# Patient Record
Sex: Female | Born: 1935 | Race: Black or African American | Hispanic: No | Smoking: Never smoker
Health system: Southern US, Community
[De-identification: ages and names within clinical notes are randomized; demographics above are authoritative.]

## PROBLEM LIST (undated history)

## (undated) ENCOUNTER — Emergency Department (HOSPITAL_COMMUNITY): Admission: EM | Payer: Medicare Other | Source: Home / Self Care

## (undated) DIAGNOSIS — I10 Essential (primary) hypertension: Secondary | ICD-10-CM

## (undated) DIAGNOSIS — E785 Hyperlipidemia, unspecified: Secondary | ICD-10-CM

## (undated) DIAGNOSIS — E538 Deficiency of other specified B group vitamins: Secondary | ICD-10-CM

## (undated) HISTORY — DX: Hyperlipidemia, unspecified: E78.5

## (undated) HISTORY — DX: Deficiency of other specified B group vitamins: E53.8

## (undated) HISTORY — DX: Essential (primary) hypertension: I10

---

## 2001-09-29 ENCOUNTER — Encounter: Payer: Self-pay | Admitting: Cardiology

## 2001-09-29 ENCOUNTER — Ambulatory Visit (HOSPITAL_COMMUNITY): Admission: RE | Admit: 2001-09-29 | Discharge: 2001-09-29 | Payer: Self-pay

## 2002-12-07 ENCOUNTER — Ambulatory Visit (HOSPITAL_COMMUNITY): Admission: RE | Admit: 2002-12-07 | Discharge: 2002-12-07 | Payer: Self-pay | Admitting: Cardiology

## 2002-12-07 ENCOUNTER — Encounter: Payer: Self-pay | Admitting: Cardiology

## 2003-02-01 DIAGNOSIS — D126 Benign neoplasm of colon, unspecified: Secondary | ICD-10-CM | POA: Insufficient documentation

## 2005-11-25 ENCOUNTER — Inpatient Hospital Stay (HOSPITAL_COMMUNITY): Admission: RE | Admit: 2005-11-25 | Discharge: 2005-11-26 | Payer: Self-pay | Admitting: General Surgery

## 2007-05-26 ENCOUNTER — Ambulatory Visit: Payer: Self-pay | Admitting: Gastroenterology

## 2007-06-23 ENCOUNTER — Ambulatory Visit: Payer: Self-pay | Admitting: Gastroenterology

## 2007-12-01 DIAGNOSIS — I10 Essential (primary) hypertension: Secondary | ICD-10-CM | POA: Insufficient documentation

## 2007-12-01 DIAGNOSIS — K5730 Diverticulosis of large intestine without perforation or abscess without bleeding: Secondary | ICD-10-CM | POA: Insufficient documentation

## 2008-07-05 ENCOUNTER — Encounter: Admission: RE | Admit: 2008-07-05 | Discharge: 2008-07-05 | Payer: Self-pay | Admitting: Cardiology

## 2008-08-13 ENCOUNTER — Encounter: Admission: RE | Admit: 2008-08-13 | Discharge: 2008-08-13 | Payer: Self-pay | Admitting: Cardiology

## 2009-11-14 ENCOUNTER — Ambulatory Visit (HOSPITAL_COMMUNITY): Admission: RE | Admit: 2009-11-14 | Discharge: 2009-11-14 | Payer: Self-pay | Admitting: Cardiology

## 2011-01-26 NOTE — Assessment & Plan Note (Signed)
[facility]                         GASTROENTEROLOGY OFFICE NOTE   NAME:[name], [name]                  MRN:          [id number]  DATE:05/26/2007                            DOB:          1930/05/28    REFERRING PHYSICIAN:  Osvaldo Patton. [name], M.D.   NEW PATIENT EVALUATION:  [name] is a 77 year old African-American  female with essential hypertension, who is status post repair of a  rather prominent femoral hernia in the spring of 2007.  She continues  with mild constipation, gas and bloating, and is referred by Dr. Shana Patton  for consideration of colonoscopy.  She denies rectal bleeding or melena,  anorexia or weight-loss, any upper GI or hepatobiliary complaints.  Her  appetite is good and her weight is stable.   She had colonoscopy in May of 2004, with removal of an 8 mm adenomatous  polyp.   Recent lab data at Dr. Magda Patton office showed a normal CBC and  metabolic profile.  Actually, in reviewing these labs, these are from  2005.  At that time, she also had CT scan of the abdomen, which  demonstrated a femoral hernia and calcified uterine fibroids.   PAST MEDICAL HISTORY:  [name] has hypertension, but has never had  strokes or peripheral vascular disease, MIs or angina pectoris.   She takes Norvasc 10 mg a day, daily atenolol and multivitamins.   She denies drug allergies.   FAMILY HISTORY:  Remarkable for diabetes and atherosclerotic  cardiovascular disease in multiple family members.   SOCIAL HISTORY:  The patient is widowed and lives with her daughter.  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  She does  not smoke or use ethanol.   REVIEW OF SYSTEMS:  Entirely noncontributory, without any current  cardiovascular, pulmonary, neurological or psychiatric problems.   EXAM:  Shows her to be a healthy, black female, in no acute distress.  She is 4 feet 11 inches tall and weighs 141 pounds.  Blood pressure is  124/66 and pulse was 56 and regular.  I could not appreciate stigmata of chronic liver disease or thyromegaly.  Her chest was clear to percussion and auscultation and she seemed to be  in a regular rhythm without significant murmurs, gallops or rubs.  There was no organomegaly, masses or tenderness.  She had multiple well-  healed lower abdominal surgical scars.  Peripheral extremities were unremarkable.  Mental status was clear.  Inspection of rectum was unremarkable, as was rectal exam.  Stool was  guaiac-negative.   ASSESSMENT:  1. Need for followup colonoscopy in a 77 year old African-American      female, who had a previous significant adenomatous polyp.  2. Mild functional constipation.  3. Well-controlled essential hypertension.  4. Status post repair of a right femoral hernia.  5. Calcified uterine fibroids.   RECOMMENDATIONS:  I have gone ahead and scheduled [name] for  outpatient colonoscopy at her convenience.  I have asked her to keep  herself well-hydrated with the bowel prep and we will monitor her  closely during this exam.  The risks and benefits have been explained in  detail and  she has agreed to proceed as planned.  She will continue all  of her other medications as per Dr. Shana Patton.     Gail Rea. Jarold Motto, MD, [name], [name]  Electronically Signed    DRP/MedQ  DD: 05/26/2007  DT: 05/26/2007  Job #: [id number]

## 2011-01-29 NOTE — Op Note (Signed)
[name], [name]           ACCOUNT NO.:  [id number]   MEDICAL RECORD NO.:  [id number]          PATIENT TYPE:  AMB   LOCATION:  DAY                          FACILITY:  [facility]   PHYSICIAN:  Leonie Man, M.D.   DATE OF BIRTH:  18-Aug-1930   DATE OF PROCEDURE:  11/24/2005  DATE OF DISCHARGE:                                 OPERATIVE REPORT   PREOPERATIVE DIAGNOSIS:  Abdominal wall hernia right lower quadrant.   POSTOPERATIVE DIAGNOSIS:  Abdominal wall hernia right lower quadrant.   PROCEDURE:  Laparoscopy and open abdominal wall hernia repair with mesh.   Moure:  Dr. Lurene Shadow   ASSISTANT:  Dr. Lebron Conners.   ANESTHESIA:  General.   Note, Ms. Chapa is a 77 year old lady who presents with an abdominal wall  hernia presenting above the superior anterior iliac spine at the area of the  rectus and oblique muscles.  Thought originally to be a spigelian hernia.  She comes to the operating room for repair of this hernia with the  understanding that we would attempt a laparoscopic repair if this were not  possible, then we would go ahead and do an open repair.  Other risks and  potential benefits of surgery have been discussed, all questions answered  and consent obtained.   PROCEDURE:  Following the induction of satisfactory general anesthesia with  the patient positioned supinely, the abdomen was prepped and draped to be  included in a sterile operative field.  Open laparoscopy is created by  placing a Hassan cannula in the epigastrium and insufflation of the  peritoneal cavity to 14 mmHg using carbon dioxide.  The scope was placed in  the abdomen; multiple adhesions to the anterior abdominal wall are taken  down to the edge of the hernia sac at the medial and inferior edge of the  hernia sac.  We noted that the uterus was directly involved with the hernial  defect and would need to be taken down.  This attachment continued down deep  into the pelvis.  It was therefore  decided that degree of dissection was not  going to be possible, so the laparoscopic approach was abandoned and  attention turned to an open approach.  A transverse incision was made over  the hernial defect, deepened through the skin and subcutaneous tissues, and  the external oblique chondrosis was opened up, carrying this down to the  hernial defect.  However, because of the attachments of the uterus to the  hernial defect, the peritoneum was opened, and the attachments of the uterus  to the hernial defect were taken down.  Having accomplished this, the defect  was followed down and was noted to go down towards the internal inguinal  canal and to involve this.  We therefore decided to dissect superficially  above the internal oblique to fully free the defect.  I then preplaced a  Prolene hernia mesh system through the lower portion of the defect and  protecting the bowel away from the internal portion of the mesh by placing a  Seprafilm over the bowel at this point where it would it come in contact  with mesh.  The exterior portion of the mesh was then deployed down to the  pubic tubercle and continued up the along the conjoined tendon, and the  previous internal ring and again from the pubic tubercle up along the  shelving edge of Poupart's ligament.  The upper portion of the defect was  then repaired after the anterior rectus fascia was closed by placing an  additional mesh that was deployed under the external oblique aponeurosis  over the defect.  This was tacked down and held in place with 0 Novofil  sutures.  Having then accomplished the anterior rectus fascia, the external  oblique aponeurosis was closed over the mesh with a running 2-0 Novofil  suture.  Subcutaneous tissues were then irrigated with normal saline.  Sponge, instrument, and sharp counts were verified.  Subcutaneous tissues closed with running 2-0 Vicryl sutures and the skin  closed with staples.  All trocar sites  were then closed with staples,  sterile dressings applied, anesthetic reversed, and the patient removed from  the operating room to the recovery room in stable condition.  She tolerated  the procedure well.      Leonie Man, M.D.  Electronically Signed     PB/MEDQ  D:  11/24/2005  T:  11/25/2005  Job:  [id number]   cc:   Osvaldo Shipper. Spruill, M.D.  Fax: [phone]

## 2012-07-19 ENCOUNTER — Other Ambulatory Visit: Payer: Self-pay | Admitting: Cardiology

## 2012-07-19 DIAGNOSIS — R29890 Loss of height: Secondary | ICD-10-CM

## 2012-07-19 DIAGNOSIS — Z1231 Encounter for screening mammogram for malignant neoplasm of breast: Secondary | ICD-10-CM

## 2012-08-30 ENCOUNTER — Ambulatory Visit: Payer: Self-pay

## 2012-08-30 ENCOUNTER — Other Ambulatory Visit: Payer: Self-pay

## 2012-09-13 HISTORY — PX: INGUINAL HERNIA REPAIR: SUR1180

## 2012-09-27 ENCOUNTER — Ambulatory Visit
Admission: RE | Admit: 2012-09-27 | Discharge: 2012-09-27 | Disposition: A | Discharge status: Home / Self Care | Payer: Medicare Other | Source: Ambulatory Visit | Attending: Cardiology | Admitting: Cardiology

## 2012-09-27 DIAGNOSIS — R29890 Loss of height: Secondary | ICD-10-CM

## 2012-09-27 DIAGNOSIS — Z1231 Encounter for screening mammogram for malignant neoplasm of breast: Secondary | ICD-10-CM

## 2013-09-13 DIAGNOSIS — I10 Essential (primary) hypertension: Secondary | ICD-10-CM | POA: Insufficient documentation

## 2013-09-13 HISTORY — DX: Essential (primary) hypertension: I10

## 2015-12-15 ENCOUNTER — Other Ambulatory Visit (HOSPITAL_COMMUNITY)
Admission: RE | Admit: 2015-12-15 | Discharge: 2015-12-15 | Disposition: A | Discharge status: Home / Self Care | Payer: Medicare Other | Source: Ambulatory Visit | Attending: Cardiology | Admitting: Cardiology

## 2015-12-15 ENCOUNTER — Other Ambulatory Visit (HOSPITAL_COMMUNITY): Payer: Self-pay | Admitting: Cardiology

## 2015-12-15 ENCOUNTER — Ambulatory Visit (HOSPITAL_COMMUNITY)
Admission: RE | Admit: 2015-12-15 | Discharge: 2015-12-15 | Disposition: A | Discharge status: Home / Self Care | Payer: Medicare Other | Source: Ambulatory Visit | Attending: Cardiology | Admitting: Cardiology

## 2015-12-15 DIAGNOSIS — R0602 Shortness of breath: Secondary | ICD-10-CM | POA: Diagnosis present

## 2015-12-15 DIAGNOSIS — I10 Essential (primary) hypertension: Secondary | ICD-10-CM | POA: Diagnosis not present

## 2015-12-15 LAB — VITAMIN B12: VITAMIN B 12: 303 pg/mL (ref 180–914)

## 2015-12-15 LAB — CBC
HEMATOCRIT: 42.7 % (ref 36.0–46.0)
HEMOGLOBIN: 14.2 g/dL (ref 12.0–15.0)
MCH: 26.8 pg (ref 26.0–34.0)
MCHC: 33.3 g/dL (ref 30.0–36.0)
MCV: 80.6 fL (ref 78.0–100.0)
Platelets: 150 10*3/uL (ref 150–400)
RBC: 5.3 MIL/uL — AB (ref 3.87–5.11)
RDW: 13.1 % (ref 11.5–15.5)
WBC: 7.5 10*3/uL (ref 4.0–10.5)

## 2015-12-15 LAB — URINALYSIS, ROUTINE W REFLEX MICROSCOPIC
Bilirubin Urine: NEGATIVE
Glucose, UA: NEGATIVE mg/dL
Hgb urine dipstick: NEGATIVE
KETONES UR: NEGATIVE mg/dL
NITRITE: NEGATIVE
PH: 6 (ref 5.0–8.0)
Protein, ur: NEGATIVE mg/dL
SPECIFIC GRAVITY, URINE: 1.017 (ref 1.005–1.030)

## 2015-12-15 LAB — LIPID PANEL
Cholesterol: 202 mg/dL — ABNORMAL HIGH (ref 0–200)
HDL: 101 mg/dL (ref >40)
LDL CALC: 90 mg/dL (ref 0–99)
TRIGLYCERIDES: 54 mg/dL (ref <150)
Total CHOL/HDL Ratio: 2 RATIO
VLDL: 11 mg/dL (ref 0–40)

## 2015-12-15 LAB — URINE MICROSCOPIC-ADD ON

## 2015-12-15 LAB — HEPATIC FUNCTION PANEL
ALBUMIN: 4 g/dL (ref 3.5–5.0)
ALK PHOS: 70 U/L (ref 38–126)
ALT: 21 U/L (ref 14–54)
AST: 28 U/L (ref 15–41)
BILIRUBIN TOTAL: 0.5 mg/dL (ref 0.3–1.2)
Bilirubin, Direct: <0.1 mg/dL — ABNORMAL LOW (ref 0.1–0.5)
Total Protein: 7.3 g/dL (ref 6.5–8.1)

## 2015-12-15 LAB — TSH: TSH: 1.412 u[IU]/mL (ref 0.350–4.500)

## 2015-12-15 LAB — T4, FREE: Free T4: 0.72 ng/dL (ref 0.61–1.12)

## 2015-12-16 LAB — MICROALBUMIN, URINE: MICROALB UR: 10.8 ug/mL — AB

## 2015-12-17 ENCOUNTER — Other Ambulatory Visit: Payer: Self-pay | Admitting: Cardiology

## 2015-12-17 DIAGNOSIS — Z1231 Encounter for screening mammogram for malignant neoplasm of breast: Secondary | ICD-10-CM

## 2015-12-17 DIAGNOSIS — E2839 Other primary ovarian failure: Secondary | ICD-10-CM

## 2015-12-24 LAB — HEPATITIS PANEL, ACUTE

## 2016-01-06 ENCOUNTER — Ambulatory Visit: Payer: Medicare Other

## 2016-01-06 ENCOUNTER — Other Ambulatory Visit: Payer: Medicare Other

## 2016-01-16 ENCOUNTER — Ambulatory Visit
Admission: RE | Admit: 2016-01-16 | Discharge: 2016-01-16 | Disposition: A | Discharge status: Home / Self Care | Payer: Medicare Other | Source: Ambulatory Visit | Attending: Cardiology | Admitting: Cardiology

## 2016-01-16 DIAGNOSIS — E2839 Other primary ovarian failure: Secondary | ICD-10-CM

## 2016-01-16 DIAGNOSIS — Z1231 Encounter for screening mammogram for malignant neoplasm of breast: Secondary | ICD-10-CM

## 2016-09-13 DIAGNOSIS — E785 Hyperlipidemia, unspecified: Secondary | ICD-10-CM

## 2016-09-13 HISTORY — DX: Hyperlipidemia, unspecified: E78.5

## 2018-04-26 ENCOUNTER — Ambulatory Visit (INDEPENDENT_AMBULATORY_CARE_PROVIDER_SITE_OTHER): Payer: Medicare Other | Admitting: Internal Medicine

## 2018-04-26 ENCOUNTER — Encounter: Payer: Self-pay | Admitting: Internal Medicine

## 2018-04-26 VITALS — BP 122/78 | HR 64 | Resp 12 | Ht <= 58 in | Wt 111.0 lb

## 2018-04-26 DIAGNOSIS — I10 Essential (primary) hypertension: Secondary | ICD-10-CM | POA: Diagnosis not present

## 2018-04-26 DIAGNOSIS — Z79899 Other long term (current) drug therapy: Secondary | ICD-10-CM

## 2018-04-26 DIAGNOSIS — R252 Cramp and spasm: Secondary | ICD-10-CM

## 2018-04-26 DIAGNOSIS — E7849 Other hyperlipidemia: Secondary | ICD-10-CM | POA: Diagnosis not present

## 2018-04-26 DIAGNOSIS — E538 Deficiency of other specified B group vitamins: Secondary | ICD-10-CM

## 2018-04-26 HISTORY — DX: Deficiency of other specified B group vitamins: E53.8

## 2018-04-26 MED ORDER — B COMPLEX PO TABS: 1.0000 | ORAL_TABLET | Freq: Every day | ORAL | Status: AC

## 2018-04-26 MED ORDER — AMLODIPINE BESYLATE 10 MG PO TABS: 10.0000 mg | ORAL_TABLET | Freq: Every day | ORAL | 11 refills | Status: AC

## 2018-04-26 NOTE — Progress Notes (Signed)
Subjective:    Patient ID: [name], female    DOB: 07-04-1930, 84 y.o.   MRN: [id number]  HPI   Here to establish  Was seen recently on 04/24/18 for an appt with family medicine with [facility] clinic her granddaughteer made for her due to her granddaughter's concern for exertional dyspnea. Patient denies shortness of breath with regular activities, but if she rushes, she may feel winded.  She can get a small amount of left chest discomfort only with twisting quickly of her torso.  If she just walks fast, she does not get the discomfort.  The discomfort is there and gone and she continues to do whatever she is doing without a problem. Her granddaughter has MD and sometimes the patient is helping her with activities.  The patient's daughter and another granddaughter also live in the household and help with her care.  They also have home health aid. Does get mild ankle edema if her legs are dependent for long periods, but not when she is up on her feet moving around.  1.  Essential Hypertension:  Diagnosed about 5 years ago.  Amlodipine 10 mg daily.  2.  Low B12:  Reported by another physician from visit on the 12th, though unable to find this in Care Everywhere labs or labs in [facility] chart.  Last check was in 2017 that can be found and measured 303, which is normal.  She was started on Vitamin B12 patient states just at last visit, but she has not started. This was discussed in relation to leg cramps per patient.  Generally occurs at night in bed.  Does not sound frequent and more related to when her legs are cold.  If she covers her legs, the discomfort improve.  CBC 2 days ago was normal.  Addendum to Low B12:  Ultimately found B12 result from 07/20/2017 at 235 with low end of range at 200.  CBC same time with high RBCs, but normal Hgb/Hct and MCV.  No mention of segmented neutrophils or follow up labs.  Current Meds  Medication Sig  . amLODipine (NORVASC) 10 MG tablet Take 1 tablet  (10 mg total) by mouth daily.  Marland Kitchen aspirin 81 MG chewable tablet Chew by mouth daily.   . [DISCONTINUED] amLODipine (NORVASC) 10 MG tablet Take 10 mg by mouth daily.  . [DISCONTINUED] Cyanocobalamin 1000 MCG/ML LIQD Place under the tongue.    No Known Allergies   Past Medical History:  Diagnosis Date  . Hyperlipidemia 2018   Mildly elevated total with very high HDL >100  . Hypertension 2015   Past Surgical History:  Procedure Laterality Date  . CESAREAN SECTION      x 4  . INGUINAL HERNIA REPAIR Right 2014   Family History  Problem Relation Age of Onset  . Arthritis Mother   . Stroke Brother   . Stroke Brother   . Dementia Brother   . Stroke Brother     Social History   Socioeconomic History  . Marital status: Widowed    Spouse name: Taylr Meuth  . Number of children: 4  . Years of education: 19  . Highest education level: High school graduate  Occupational History  . 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 Financial resource strain: Not on file  . Food insecurity:    Worry: Never true    Inability: Never true  .  Transportation needs:    Medical: No    Non-medical: No  Tobacco Use  . Smoking status: Never Smoker  . Smokeless tobacco: Never Used  Substance and Sexual Activity  . Alcohol use: Never    Frequency: Never  . Drug use: Never  . Sexual activity: Not on file  Lifestyle  . Physical activity:    Days per week: Not on file    Minutes per session: Not on file  . Stress: Only a little  Relationships  . Social connections:    Talks on phone: Not on file    Gets together: Not on file    Attends religious service: Not on file    Active member of club or organization: Not on file    Attends meetings of clubs or organizations: Not on file    Relationship status: Not on file  . Intimate partner violence:    Fear of current or ex partner: Not on file     Emotionally abused: No    Physically abused: No    Forced sexual activity: Not on file  Other Topics Concern  . Not on file  Social History Narrative   Retired   [employer] with one of her sons at [employer] with your daughter, 2 granddaughters, one granddaughter with MD and requires a lot of care.      Review of Systems     Objective:   Physical Exam NAD HEENT:  PERRL, EOMI, discs sharp, TMs pearly gray, throat without injection.  Dentures upper and lower. Neck:  Supple, no adenopathy, no thyromegaly Chest:  CTA CV:  RRR with normal S1 and S2, No S3, S4 or murmur.  No carotid bruits.  Carotid, radial and DP pulses normal and equal Abd:  S, NT, No HSM or mass, + BS      Assessment & Plan:  1.  Mild breathlessness if gets in a hurry.  Patient not concerned with this.  She understands to call if this worsens and affects her at her usual pace or develops new associated symptoms.  2.  Hypertension:  Controlled  3.  Borderline B12, but not showing an affect on CBC currently:  To take a Vitamin B complex with 1000 mcg of  B12.  4.  Leg cramps:  Vitamin B complex daily.  Ivory soap in between fitted and flat sheet.  Health Maintenance:  followup for CPE without pap in 3 months.  To call regarding flu vaccine clinics beginning of September.

## 2018-04-26 NOTE — Patient Instructions (Addendum)
Can google "advance directives, [place]"  And bring up form from Secretary of [place]. Print and fill out Or can go to "5 wishes"  Which is also in Spanish and fill out--this costs $5--perhaps easier to use. Designate a Medical Power of Attorney to speak for you if you are unable to speak for yourself when ill or injured  Free flu vaccine at flu vaccine clinics:  Call beginning of September for information regarding when we will hold our free flu clinics:  likely the orange card sign up in September and October and the [place] next door to clinic on September 28th, Saturday.

## 2018-04-26 NOTE — Progress Notes (Signed)
Patient ID: [name], female   DOB: Jul 04, 1930, 84 y.o.   MRN: [id number]  LCSW completed new patient screening with patient in order to assess for mental health concerns and/or problems with social determinants of health (food, housing, transportation, interpersonal violence). LCSW administered a PHQ-9 in order to assess the level of current depressive symptoms.  [name] stated that she at times feels that she has low self-esteem, but did not feel that it was a serious issue. She reported no other mental health concerns; she scored a 1 on the PHQ-9, indicating no real depressive symptoms. She shared that all of her basic needs are being met and she has no concerns with SDOH. She shared that she tries to stay active and involved in the community.

## 2018-04-27 LAB — COMPREHENSIVE METABOLIC PANEL
A/G RATIO: 1.6 (ref 1.2–2.2)
ALBUMIN: 4.3 g/dL (ref 3.5–4.7)
ALT: 18 IU/L (ref 0–32)
AST: 28 IU/L (ref 0–40)
Alkaline Phosphatase: 69 IU/L (ref 39–117)
BILIRUBIN TOTAL: 0.3 mg/dL (ref 0.0–1.2)
BUN / CREAT RATIO: 19 (ref 12–28)
BUN: 14 mg/dL (ref 8–27)
CHLORIDE: 100 mmol/L (ref 96–106)
CO2: 26 mmol/L (ref 20–29)
Calcium: 9.7 mg/dL (ref 8.7–10.3)
Creatinine, Ser: 0.75 mg/dL (ref 0.57–1.00)
GFR calc Af Amer: 83 mL/min/{1.73_m2} (ref >59)
GFR calc non Af Amer: 72 mL/min/{1.73_m2} (ref >59)
Globulin, Total: 2.7 g/dL (ref 1.5–4.5)
Glucose: 87 mg/dL (ref 65–99)
POTASSIUM: 4 mmol/L (ref 3.5–5.2)
SODIUM: 139 mmol/L (ref 134–144)
Total Protein: 7 g/dL (ref 6.0–8.5)

## 2018-07-25 ENCOUNTER — Ambulatory Visit: Payer: Medicare Other | Admitting: Internal Medicine

## 2018-07-25 ENCOUNTER — Encounter: Payer: Self-pay | Admitting: Internal Medicine

## 2018-07-25 VITALS — BP 148/80 | HR 82 | Resp 12 | Ht <= 58 in | Wt 109.0 lb

## 2018-07-25 DIAGNOSIS — R252 Cramp and spasm: Secondary | ICD-10-CM | POA: Diagnosis not present

## 2018-07-25 DIAGNOSIS — I10 Essential (primary) hypertension: Secondary | ICD-10-CM

## 2018-07-25 DIAGNOSIS — Z0000 Encounter for general adult medical examination without abnormal findings: Secondary | ICD-10-CM

## 2018-07-25 DIAGNOSIS — H269 Unspecified cataract: Secondary | ICD-10-CM

## 2018-07-25 DIAGNOSIS — Z23 Encounter for immunization: Secondary | ICD-10-CM | POA: Diagnosis not present

## 2018-07-25 MED ORDER — PSYLLIUM 0.52 G PO CAPS: 0.5200 g | ORAL_CAPSULE | Freq: Every day | ORAL | Status: AC

## 2018-07-25 NOTE — Progress Notes (Signed)
Subjective:    Patient ID: [name], female    DOB: [birth year], 84 y.o.   MRN: [id number]  HPI  CPE without pap  1.  Pap: Has never had an abnormal pap.  Last was many years ago.  Has not been sexually active for years.  No family history of cervical cancer.  2.  Mammogram:  Last was 01/2016.  Always normal.  No family history of breast cancer.    3.  Osteoprevention:  She does not eat or drink dairy much.  She does like milk.  Is on a treadmill 6 days weekly for 10-15 minutes.  Does not do a lot of walking outside.  Does feel she still has good balance.  Walks a lot in her home.    4.  Guaiac Cards: cannot recall.   5.  Colonoscopy: Last was in 2008 and with diverticulosis only.  She is not certain she wants this repeated.    6.  Immunizations:  Cannot recall her last tetanus.  A bit hesitant about flu vaccine, but after discussion, decides to get today.  7.  Glucose/Cholesterol:  Blood sugar fine in August.  Her last FLP in 2017 was quite good.    Current Meds  Medication Sig  . amLODipine (NORVASC) 10 MG tablet Take 1 tablet (10 mg total) by mouth daily.  Marland Kitchen aspirin 81 MG chewable tablet Chew by mouth daily.   Marland Kitchen b complex vitamins tablet Take 1 tablet by mouth daily.    No Known Allergies   Past Medical History:  Diagnosis Date  . Hyperlipidemia 2018   Mildly elevated total with very high HDL >100  . Hypertension 2015  . Low serum vitamin B12 04/26/2018   Borderline low Vitamin B12 level 2018   Past Surgical History:  Procedure Laterality Date  . CESAREAN SECTION      x 4  . INGUINAL HERNIA REPAIR Right 2014   Family History  Problem Relation Age of Onset  . Arthritis Mother   . Stroke Brother   . Stroke Brother   . Dementia Brother   . Stroke Brother    Social History   Socioeconomic History  . Marital status: Widowed    Spouse name: Karianne Nogueira  . Number of children: 4  . Years of education: 42  . Highest education level: High  school graduate  Occupational History  . 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 Financial resource strain: Not on file  . Food insecurity:    Worry: Never true    Inability: Never true  . Transportation needs:    Medical: No    Non-medical: No  Tobacco Use  . Smoking status: Never Smoker  . Smokeless tobacco: Never Used  Substance and Sexual Activity  . Alcohol use: Never    Frequency: Never  . Drug use: Never  . Sexual activity: Not on file  Lifestyle  . Physical activity:    Days per week: Not on file    Minutes per session: Not on file  . Stress: Only a little  Relationships  . Social connections:    Talks on phone: Not on file    Gets together: Not on file    Attends religious service: Not on file    Active member of club or organization: Not on file    Attends meetings of clubs or organizations: Not on file  Relationship status: Not on file  . Intimate partner violence:    Fear of current or ex partner: Not on file    Emotionally abused: No    Physically abused: No    Forced sexual activity: Not on file  Other Topics Concern  . Not on file  Social History Narrative   Retired   [employer] with one of her sons at [employer] with your daughter, 2 granddaughters, one granddaughter with MD and requires a lot of care.      Review of Systems  Constitutional: Negative for appetite change and fatigue.  HENT: Negative for dental problem (dentures), ear pain, hearing loss, rhinorrhea and sore throat.   Eyes: Negative for visual disturbance (wears reading glasses).  Respiratory: Negative for cough and shortness of breath (If does not hurry too much).   Cardiovascular: Negative for chest pain (No problems if does not hurry.), palpitations and leg swelling.  Gastrointestinal: Negative for abdominal pain, blood in stool (No melena), constipation and diarrhea.    Genitourinary: Positive for frequency. Negative for dysuria, urgency and vaginal discharge.  Musculoskeletal: Negative for arthralgias.       Aching of tingling in legs--goes away with rubbing.  Maybe a bit better with taking Vitamin B12.  Skin: Negative for rash.  Neurological: Negative for weakness and numbness.  Psychiatric/Behavioral: Negative for dysphoric mood. The patient is not nervous/anxious.        Objective:   Physical Exam  Constitutional: She appears well-developed and well-nourished.  HENT:  Head: Normocephalic and atraumatic.  Right Ear: Hearing, tympanic membrane and external ear normal.  Left Ear: Hearing, tympanic membrane and external ear normal.  Nose: Nose normal.  Mouth/Throat: Uvula is midline, oropharynx is clear and moist and mucous membranes are normal.  Limited view of TMs bilaterally due to crusty dry cerumen blocking much of view.  What can be seen of superior TMs is pearly gray and normal. Patient admits to using Q tips in ears.  Upper and lower dentures.  Eyes: Pupils are equal, round, and reactive to light. Conjunctivae and EOM are normal.  Appears to have cataracts--unable to see discs well.  Neck: Normal range of motion and full passive range of motion without pain. Neck supple. No thyromegaly present.  Cardiovascular: Normal rate, regular rhythm, S1 normal and S2 normal. Exam reveals no S3 and no S4.  Murmur (Grade II-III/VI heard best at right sternal border, about 2nd interspace.  Heart sounds actually more prominent to right of sternum.  ) heard. No carotid bruits, but at times can hear radiation of murmur.    Carotid, radial, femoral, DP and PT pulses normal and equal.   Pulmonary/Chest: Effort normal and breath sounds normal. Right breast exhibits no inverted nipple, no mass, no nipple discharge, no skin change and no tenderness. Left breast exhibits no inverted nipple, no mass, no nipple discharge, no skin change and no tenderness.  Abdominal:  Soft. Bowel sounds are normal. There is no hepatosplenomegaly. There is no tenderness. No hernia.  Right abdomen from hepatic area to RLQ with firmness and decreased resonance to percussion.  NT over this area. Nonpulsatile.  Genitourinary:  Genitourinary Comments: Normal external female genitalia.  Significant vaginal atrophy so that unable to perform bimanual to better assess the right sided firmness. Rectal:  No mass, relatively hard stool palpated which tested heme negative.   Musculoskeletal: Normal range of motion.  Feet with 2nd toe overlapping great toes.  Toenails with thickening and discoloration.  Lymphadenopathy:       Head (right side): No submental and no submandibular adenopathy present.       Head (left side): No submental and no submandibular adenopathy present.    She has no cervical adenopathy.    She has no axillary adenopathy.       Right: No inguinal and no supraclavicular adenopathy present.       Left: No inguinal and no supraclavicular adenopathy present.  Neurological: She is alert. She has normal strength and normal reflexes. No cranial nerve deficit or sensory deficit. Coordination and gait normal.  Skin: Skin is warm. Capillary refill takes less than 2 seconds. No rash noted.  Psychiatric: She has a normal mood and affect. Her speech is normal and behavior is normal. Cognition and memory are normal.       Assessment & Plan:  1.  CPE without pap Patient to contemplate if she wants to pursue mammogram or colonoscopy Guaiac cards x 3 to return in 2 weeks. FLP when returns in 2 weeks--has been excellent in past--last checked 2017. Influenza vaccine.  2.  Hypertension:  Recheck bp in 2 weeks  3.  Right abdominal firmness/mass:  Not clear if this is stool.  Suspect she actually is chronically constipated.  To start Metamucil regularly and increase water intake.  Recheck abdomen in 2 weeks.  4.  Leg complaints:  Not clear how much of this is muscle cramps vs  peripheral neuropathic pain.  Try Ivory soap in between sheets at foot of bed.  5.  Cataracts:  Eye referral   4.

## 2018-07-25 NOTE — Patient Instructions (Addendum)
Can google "advance directives, [place]"  And bring up form from Secretary of [place]. Print and fill out Or can go to "5 wishes"  Which is also in Spanish and fill out--this costs $5--perhaps easier to use. Designate a Forensic scientist to speak for you if you are unable to speak for yourself when ill or injured  Please think about whether you want to proceed with the mammogram and colonoscopy and let me know when you follow up in 2 weeks.  Bring in completed stool cards to next visit.

## 2018-08-08 ENCOUNTER — Encounter: Payer: Self-pay | Admitting: Internal Medicine

## 2018-08-08 ENCOUNTER — Ambulatory Visit (INDEPENDENT_AMBULATORY_CARE_PROVIDER_SITE_OTHER): Payer: Medicare Other | Admitting: Internal Medicine

## 2018-08-08 VITALS — BP 130/78 | HR 60 | Resp 12 | Ht <= 58 in | Wt 108.0 lb

## 2018-08-08 DIAGNOSIS — E7849 Other hyperlipidemia: Secondary | ICD-10-CM

## 2018-08-08 DIAGNOSIS — I10 Essential (primary) hypertension: Secondary | ICD-10-CM

## 2018-08-08 DIAGNOSIS — R1900 Intra-abdominal and pelvic swelling, mass and lump, unspecified site: Secondary | ICD-10-CM | POA: Diagnosis not present

## 2018-08-08 DIAGNOSIS — R252 Cramp and spasm: Secondary | ICD-10-CM | POA: Diagnosis not present

## 2018-08-08 NOTE — Progress Notes (Signed)
   Subjective:    Patient ID: [name], female    DOB: Jun 28, 1930, 84 y.o.   MRN: [id number]  HPI   1.  Hypertension:  Elevated earlier this month at CPE.  Did not make any changes to her amlodipine/antihypertensive.    2.  Right Abdominal firmness:  No real abdominal complaints.  Eating well. Stools sometimes hard and has to strain at time.  Not drinking water much.  Not taking fiber laxative.  3.  Still wants to think about mammogram  4.  Hyperlipidemia:  FLP today.  Needs CBC as well.   5.  Leg cramps:  Has not tried the [place] soap, but not having the cramps as much.    Current Meds  Medication Sig  . amLODipine (NORVASC) 10 MG tablet Take 1 tablet (10 mg total) by mouth daily.  Marland Kitchen aspirin 81 MG chewable tablet Chew by mouth daily.   Marland Kitchen b complex vitamins tablet Take 1 tablet by mouth daily.  . psyllium (METAMUCIL) 0.52 g capsule Take 1 capsule (0.52 g total) by mouth daily. May increase as needed to soften stool   No Known Allergies  Review of Systems     Objective:   Physical Exam NAD Lungs:  CTA CV: RRR  Abd:  Still with mass effect/firmness along entire right abdomen.  NT. +BS       Assessment & Plan:  1.  Hypertension:  BP fine today.  No change to Amlodipine.  2.  HM:  Still contemplating whether she wants different cancer screens done.  Brought in one stool card only today.  3.  Right abdominal mass/firmness:  Start with Xray to see if this is stool.  Again, discussed fiber laxative and increasing water daily.  If no stool on xray, will proceed to CT scan of abdomen/pelvis. CBC.  CMP  4.  Leg cramps: not so bothersome now.  5.  Hyperlipidemia:  FLP

## 2018-08-08 NOTE — Patient Instructions (Addendum)
[facility] at [facility].  [facility] at [facility] is a premier diagnostic imaging center that offers a 64-slice CT scanner and interventional radiology services. Hours of Operation Ultrasound and Diagnostic: 8:00am-5:00pm, Monday - Friday CT scan: 8:00am-5:30pm, Monday-Friday Directions [address], [place],  [address]   Drinks 6-8 glasses of water daily--consider getting a Freight forwarder to put in fridge

## 2018-08-09 LAB — LIPID PANEL W/O CHOL/HDL RATIO
Cholesterol, Total: 200 mg/dL — ABNORMAL HIGH (ref 100–199)
HDL: 99 mg/dL (ref >39)
LDL Calculated: 88 mg/dL (ref 0–99)
TRIGLYCERIDES: 63 mg/dL (ref 0–149)
VLDL CHOLESTEROL CAL: 13 mg/dL (ref 5–40)

## 2018-08-09 LAB — COMPREHENSIVE METABOLIC PANEL
A/G RATIO: 1.6 (ref 1.2–2.2)
ALBUMIN: 4.2 g/dL (ref 3.5–4.7)
ALT: 16 IU/L (ref 0–32)
AST: 29 IU/L (ref 0–40)
Alkaline Phosphatase: 61 IU/L (ref 39–117)
BUN/Creatinine Ratio: 18 (ref 12–28)
BUN: 17 mg/dL (ref 8–27)
Bilirubin Total: 0.5 mg/dL (ref 0.0–1.2)
CALCIUM: 8.9 mg/dL (ref 8.7–10.3)
CHLORIDE: 99 mmol/L (ref 96–106)
CO2: 25 mmol/L (ref 20–29)
Creatinine, Ser: 0.95 mg/dL (ref 0.57–1.00)
GFR, EST AFRICAN AMERICAN: 63 mL/min/{1.73_m2} (ref >59)
GFR, EST NON AFRICAN AMERICAN: 54 mL/min/{1.73_m2} — AB (ref >59)
GLOBULIN, TOTAL: 2.6 g/dL (ref 1.5–4.5)
Glucose: 83 mg/dL (ref 65–99)
POTASSIUM: 3.6 mmol/L (ref 3.5–5.2)
Sodium: 141 mmol/L (ref 134–144)
TOTAL PROTEIN: 6.8 g/dL (ref 6.0–8.5)

## 2018-08-09 LAB — CBC WITH DIFFERENTIAL/PLATELET
BASOS: 1 %
Basophils Absolute: 0 10*3/uL (ref 0.0–0.2)
EOS (ABSOLUTE): 0.1 10*3/uL (ref 0.0–0.4)
EOS: 1 %
HEMATOCRIT: 40.8 % (ref 34.0–46.6)
Hemoglobin: 13.5 g/dL (ref 11.1–15.9)
IMMATURE GRANS (ABS): 0 10*3/uL (ref 0.0–0.1)
IMMATURE GRANULOCYTES: 0 %
LYMPHS: 17 %
Lymphocytes Absolute: 1.1 10*3/uL (ref 0.7–3.1)
MCH: 26.1 pg — ABNORMAL LOW (ref 26.6–33.0)
MCHC: 33.1 g/dL (ref 31.5–35.7)
MCV: 79 fL (ref 79–97)
Monocytes Absolute: 0.4 10*3/uL (ref 0.1–0.9)
Monocytes: 6 %
NEUTROS PCT: 75 %
Neutrophils Absolute: 4.8 10*3/uL (ref 1.4–7.0)
Platelets: 198 10*3/uL (ref 150–450)
RBC: 5.18 x10E6/uL (ref 3.77–5.28)
RDW: 12.8 % (ref 12.3–15.4)
WBC: 6.4 10*3/uL (ref 3.4–10.8)

## 2018-10-31 ENCOUNTER — Other Ambulatory Visit: Payer: Self-pay | Admitting: Family Medicine

## 2018-10-31 DIAGNOSIS — R011 Cardiac murmur, unspecified: Secondary | ICD-10-CM

## 2018-10-31 DIAGNOSIS — I10 Essential (primary) hypertension: Secondary | ICD-10-CM

## 2018-11-08 ENCOUNTER — Ambulatory Visit: Payer: Medicare Other

## 2018-11-08 DIAGNOSIS — I10 Essential (primary) hypertension: Secondary | ICD-10-CM | POA: Diagnosis not present

## 2018-11-08 DIAGNOSIS — R011 Cardiac murmur, unspecified: Secondary | ICD-10-CM

## 2018-11-08 DIAGNOSIS — R0609 Other forms of dyspnea: Secondary | ICD-10-CM

## 2019-10-10 ENCOUNTER — Other Ambulatory Visit: Payer: Self-pay | Admitting: Family Medicine

## 2019-10-10 DIAGNOSIS — R634 Abnormal weight loss: Secondary | ICD-10-CM

## 2019-10-19 ENCOUNTER — Ambulatory Visit
Admission: RE | Admit: 2019-10-19 | Discharge: 2019-10-19 | Disposition: A | Discharge status: Home / Self Care | Payer: Medicare Other | Source: Ambulatory Visit | Attending: Family Medicine | Admitting: Family Medicine

## 2019-10-19 DIAGNOSIS — R634 Abnormal weight loss: Secondary | ICD-10-CM

## 2020-07-17 ENCOUNTER — Ambulatory Visit: Payer: Self-pay

## 2021-06-15 IMAGING — US US ABDOMEN COMPLETE
1 series · 14 of 25 positions shown · non-contrast
Comparison: None.

CLINICAL DATA: Decreased appetite and weight loss. Abdominal
tenderness.

EXAM:
ABDOMEN ULTRASOUND COMPLETE

[Series 1: us abdomen complete · 0.17mm/px · 14 of 103 slices shown]
[im 1/103]
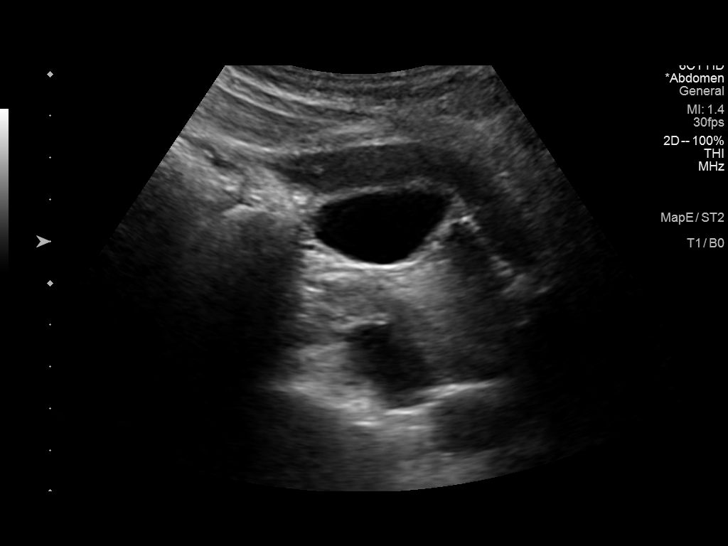
[im 9/103]
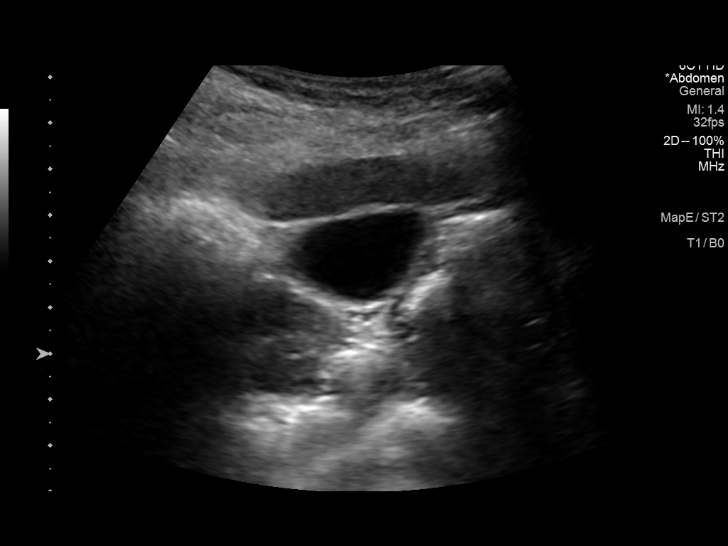
[im 18/103]
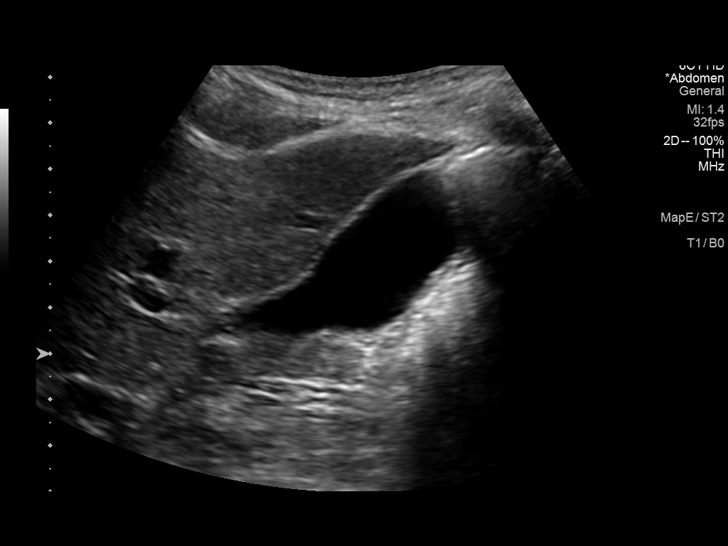
[im 26/103]
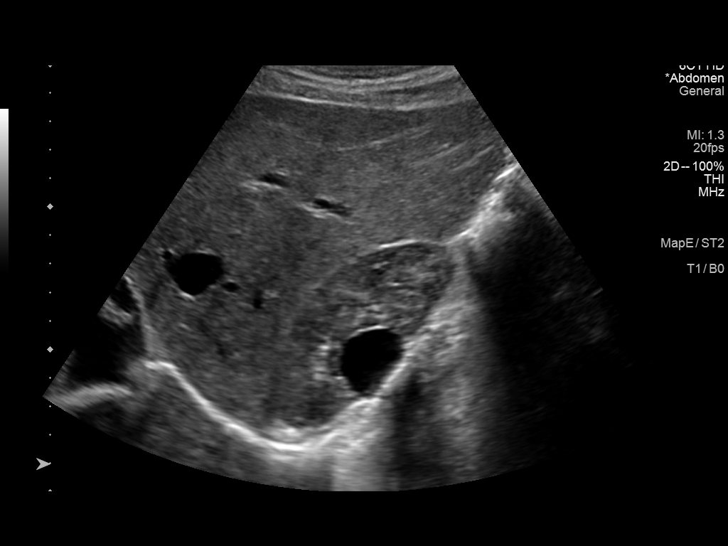
[im 35/103]
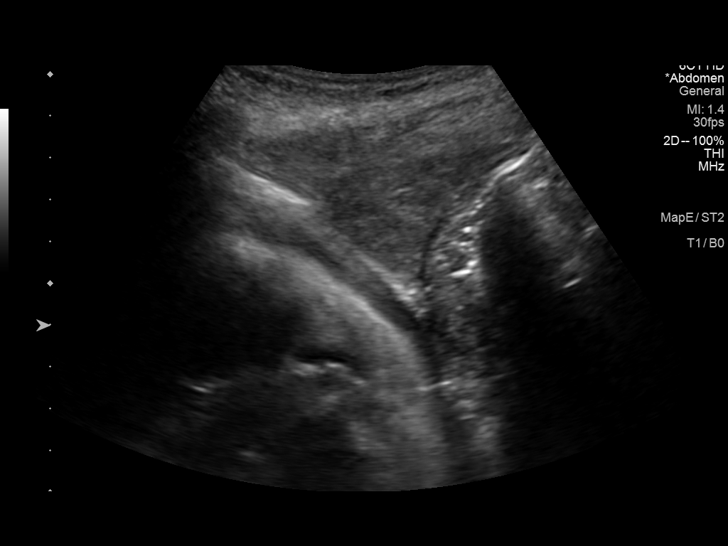
[im 39/103]
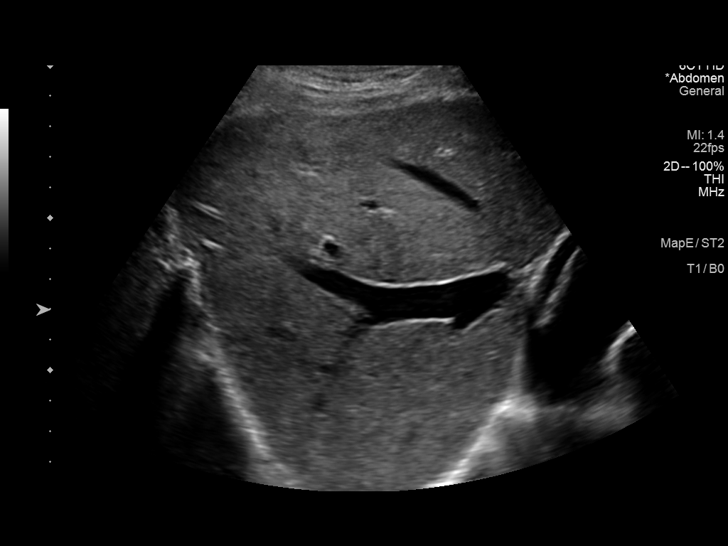
[im 47/103]
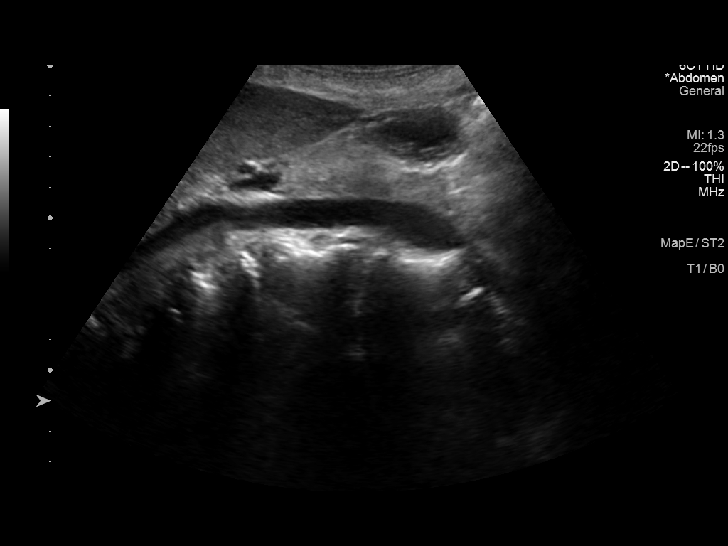
[im 56/103]
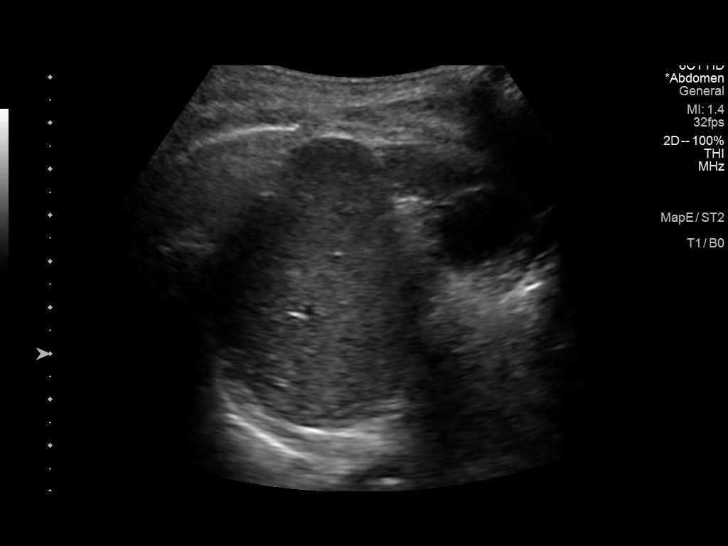
[im 64/103]
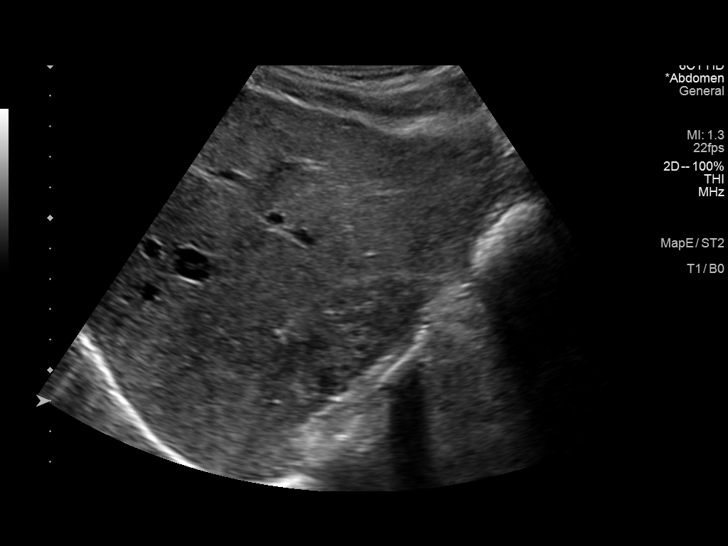
[im 69/103]
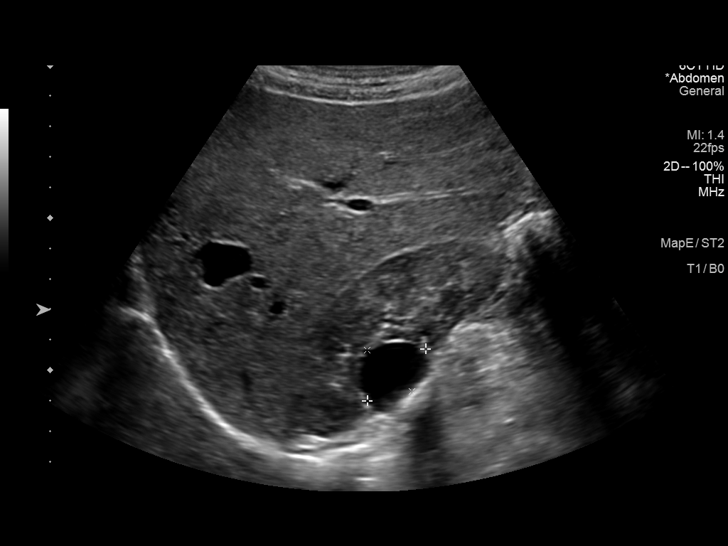
[im 77/103]
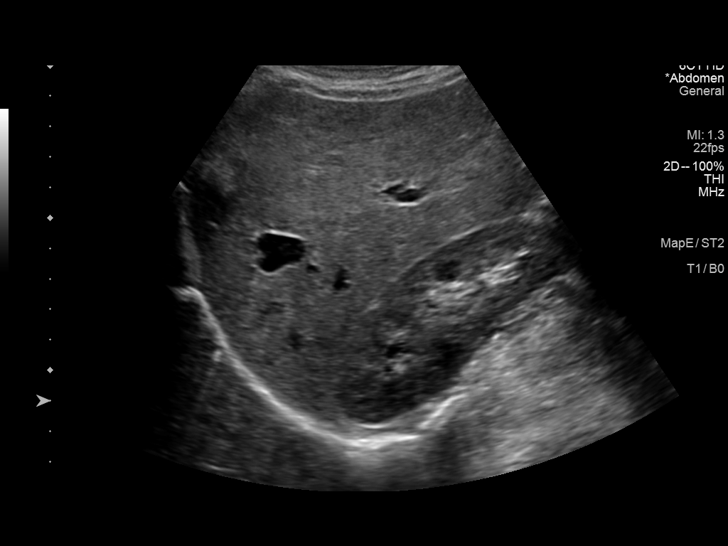
[im 86/103]
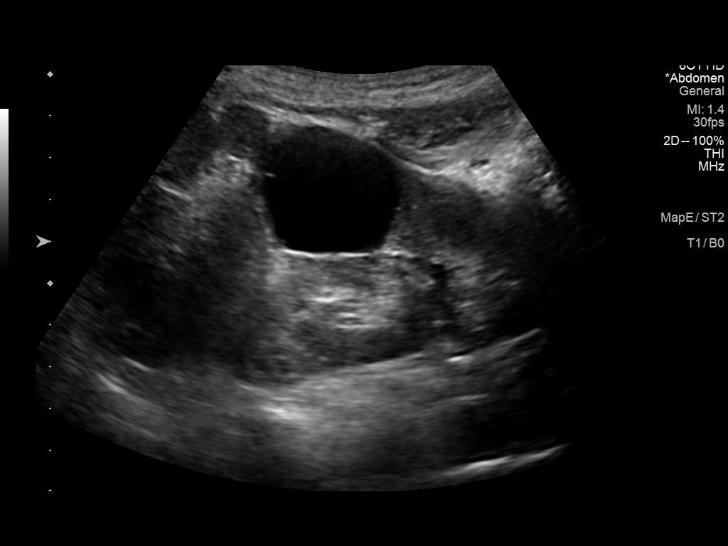
[im 94/103]
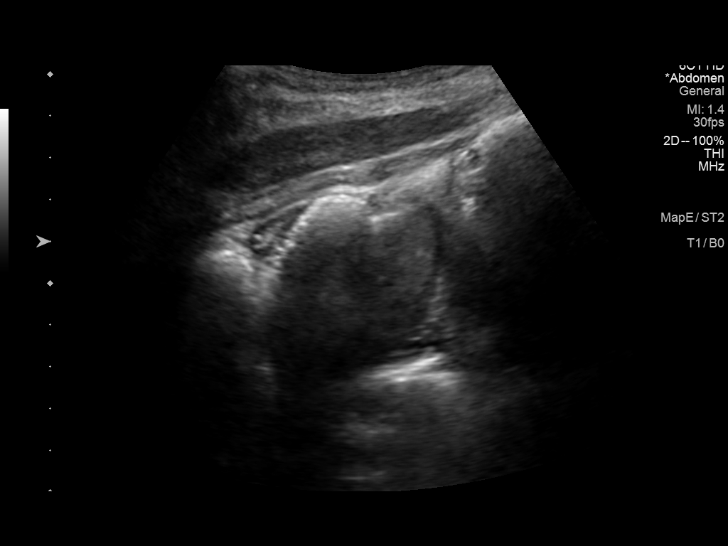
[im 103/103]
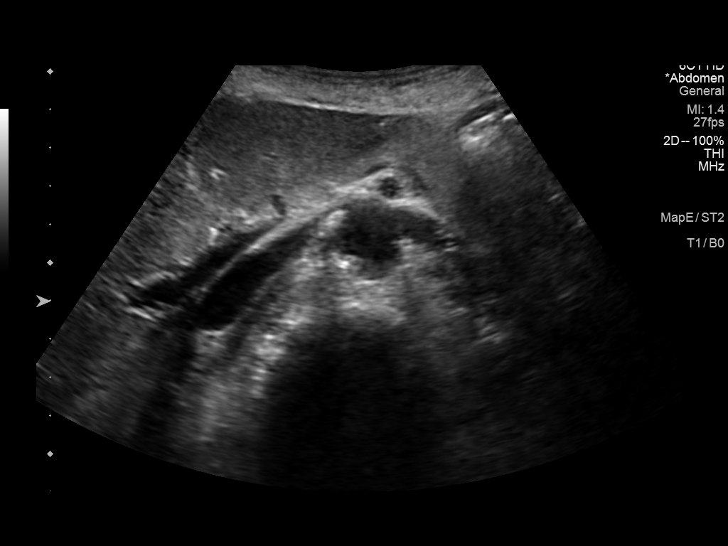

[14 of 25 positions shown; findings below may reference images not displayed]

FINDINGS: Gallbladder: No gallstones or wall thickening visualized. No
sonographic Murphy sign noted by sonographer.

Common bile duct: Diameter: 0.2 cm

Liver: No focal lesion identified. Within normal limits in
parenchymal echogenicity. Portal vein is patent on color Doppler
imaging with normal direction of blood flow towards the liver.

IVC: No abnormality visualized.

Pancreas: Visualized portion unremarkable.

Spleen: Size and appearance within normal limits.

Right Kidney: Length: 9.4 cm. Normal echogenicity. No
hydronephrosis. There are 2 adjacent anechoic cysts in the right
kidney, largest measures 2.6 cm.

Left Kidney: Length: 9.6 cm. Normal echogenicity. No hydronephrosis.
Large exophytic anechoic cyst in the left kidney that measures up to
4.0 cm.

Abdominal aorta: No aneurysm visualized. Echogenic plaque in the
aorta.

Other findings: None.
IMPRESSION: 1. No acute abnormality.
2. Normal appearance of the liver and gallbladder. No biliary
dilatation.
3. Bilateral renal cysts.
4.  Aortic Atherosclerosis ([id number]).

## 2025-02-21 ENCOUNTER — Ambulatory Visit: Admitting: Neurology
